# Patient Record
Sex: Female | Born: 2000 | Race: White | Hispanic: No | Marital: Single | Smoking: Never smoker
Health system: Southern US, Community
[De-identification: ages and names within clinical notes are randomized; demographics above are authoritative.]

## PROBLEM LIST (undated history)

## (undated) DIAGNOSIS — F419 Anxiety disorder, unspecified: Secondary | ICD-10-CM

## (undated) DIAGNOSIS — J45909 Unspecified asthma, uncomplicated: Secondary | ICD-10-CM

---
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
## 2021-06-12 ENCOUNTER — Other Ambulatory Visit: Payer: Self-pay

## 2021-06-12 ENCOUNTER — Encounter (HOSPITAL_BASED_OUTPATIENT_CLINIC_OR_DEPARTMENT_OTHER): Payer: Self-pay | Admitting: *Deleted

## 2021-06-12 ENCOUNTER — Emergency Department (HOSPITAL_BASED_OUTPATIENT_CLINIC_OR_DEPARTMENT_OTHER)
Admission: EM | Admit: 2021-06-12 | Discharge: 2021-06-12 | Disposition: A | Discharge status: Home / Self Care | Payer: 59 | Attending: Emergency Medicine | Admitting: Emergency Medicine

## 2021-06-12 DIAGNOSIS — J45909 Unspecified asthma, uncomplicated: Secondary | ICD-10-CM | POA: Insufficient documentation

## 2021-06-12 DIAGNOSIS — F410 Panic disorder [episodic paroxysmal anxiety] without agoraphobia: Secondary | ICD-10-CM | POA: Insufficient documentation

## 2021-06-12 HISTORY — DX: Anxiety disorder, unspecified: F41.9

## 2021-06-12 LAB — COMPREHENSIVE METABOLIC PANEL
ALT: 15 U/L (ref 0–44)
AST: 20 U/L (ref 15–41)
Albumin: 4.2 g/dL (ref 3.5–5.0)
Alkaline Phosphatase: 75 U/L (ref 38–126)
Anion gap: 9 (ref 5–15)
BUN: 12 mg/dL (ref 6–20)
CO2: 23 mmol/L (ref 22–32)
Calcium: 9.2 mg/dL (ref 8.9–10.3)
Chloride: 104 mmol/L (ref 98–111)
Creatinine, Ser: 0.84 mg/dL (ref 0.44–1.00)
GFR, Estimated: >60 mL/min (ref >60)
Glucose, Bld: 104 mg/dL — ABNORMAL HIGH (ref 70–99)
Potassium: 3.8 mmol/L (ref 3.5–5.1)
Sodium: 136 mmol/L (ref 135–145)
Total Bilirubin: 0.4 mg/dL (ref 0.3–1.2)
Total Protein: 7 g/dL (ref 6.5–8.1)

## 2021-06-12 LAB — CBC WITH DIFFERENTIAL/PLATELET
Abs Immature Granulocytes: 0.02 10*3/uL (ref 0.00–0.07)
Basophils Absolute: 0 10*3/uL (ref 0.0–0.1)
Basophils Relative: 1 %
Eosinophils Absolute: 0.1 10*3/uL (ref 0.0–0.5)
Eosinophils Relative: 2 %
HCT: 38.2 % (ref 36.0–46.0)
Hemoglobin: 13 g/dL (ref 12.0–15.0)
Immature Granulocytes: 0 %
Lymphocytes Relative: 26 %
Lymphs Abs: 1.5 10*3/uL (ref 0.7–4.0)
MCH: 28.6 pg (ref 26.0–34.0)
MCHC: 34 g/dL (ref 30.0–36.0)
MCV: 84 fL (ref 80.0–100.0)
Monocytes Absolute: 0.4 10*3/uL (ref 0.1–1.0)
Monocytes Relative: 7 %
Neutro Abs: 3.6 10*3/uL (ref 1.7–7.7)
Neutrophils Relative %: 64 %
Platelets: 268 10*3/uL (ref 150–400)
RBC: 4.55 MIL/uL (ref 3.87–5.11)
RDW: 14.6 % (ref 11.5–15.5)
WBC: 5.7 10*3/uL (ref 4.0–10.5)
nRBC: 0 % (ref 0.0–0.2)

## 2021-06-12 LAB — URINALYSIS, ROUTINE W REFLEX MICROSCOPIC
Bilirubin Urine: NEGATIVE
Glucose, UA: NEGATIVE mg/dL
Ketones, ur: NEGATIVE mg/dL
Leukocytes,Ua: NEGATIVE
Nitrite: NEGATIVE
Protein, ur: NEGATIVE mg/dL
Specific Gravity, Urine: 1.01 (ref 1.005–1.030)
pH: 7 (ref 5.0–8.0)

## 2021-06-12 LAB — TSH: TSH: 0.611 u[IU]/mL (ref 0.350–4.500)

## 2021-06-12 LAB — URINALYSIS, MICROSCOPIC (REFLEX)

## 2021-06-12 LAB — HCG, SERUM, QUALITATIVE: Preg, Serum: NEGATIVE

## 2021-06-12 MED ORDER — HYDROXYZINE HCL 25 MG PO TABS: 25.0000 mg | ORAL_TABLET | Freq: Three times a day (TID) | ORAL | 0 refills | Status: AC

## 2021-06-12 NOTE — ED Triage Notes (Signed)
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 States she felt like she was going to have another today and took a hydroxyzine one hour pta. Pt tearful in triage. Denies HI. Denies active SI
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
## 2021-06-12 NOTE — Discharge Instructions (Addendum)
Your laboratory results are within normal limits on today's visit.  Your thyroid level will not return for the next 48 hours, you will need to check on this level via MyChart.  You will also need to schedule an appointment with a therapist in order to receive further management for your recurrent panic attacks.  I have provided medication to help with your symptoms. Please take this medication as prescribed.

## 2023-12-11 ENCOUNTER — Encounter: Payer: No Typology Code available for payment source | Admitting: Advanced Practice Midwife

## 2023-12-31 NOTE — Progress Notes (Deleted)
   ANNUAL EXAM Patient name: Janiaya Ryser MRN [id number]  Date of birth: Mar 09, 2001 Chief Complaint:   No chief complaint on file.  History of Present Illness:   Wanisha Shiroma is a 23 y.o. No obstetric history on file. {race:25618} female being seen today for a routine annual exam.  Current complaints: ***  No LMP recorded.   The pregnancy intention screening data noted above was reviewed. Potential methods of contraception were discussed. The patient elected to proceed with No data recorded.   Last pap ***. Results were: {Pap findings:25134}. H/O abnormal pap: {yes/yes***/no:23866} Last mammogram: ***. Results were: {normal, abnormal, n/a:23837}. Family h/o breast cancer: {yes***/no:23838} Last colonoscopy: ***. Results were: {normal, abnormal, n/a:23837}. Family h/o colorectal cancer: {yes***/no:23838} STI testing:  Contraception:        No data to display               No data to display           Review of Systems:   Pertinent items are noted in HPI Denies any headaches, blurred vision, fatigue, shortness of breath, chest pain, abdominal pain, abnormal vaginal discharge/itching/odor/irritation, problems with periods, bowel movements, urination, or intercourse unless otherwise stated above. Pertinent History Reviewed:  Reviewed past medical,surgical, social and family history.  Reviewed problem list, medications and allergies. Physical Assessment:  There were no vitals filed for this visit.There is no height or weight on file to calculate BMI.        Physical Examination:   General appearance - well appearing, and in no distress  Mental status - alert, oriented to person, place, and time  Psych:  She has a normal mood and affect  Skin - warm and dry, normal color, no suspicious lesions noted  Chest - effort normal, all lung fields clear to auscultation bilaterally  Heart - normal rate and regular rhythm  Neck:  midline trachea, no thyromegaly or nodules  Breasts -  breasts appear normal, no suspicious masses, no skin or nipple changes or  axillary nodes  Abdomen - soft, nontender, nondistended, no masses or organomegaly  Pelvic - VULVA: normal appearing vulva with no masses, tenderness or lesions  VAGINA: normal appearing vagina with normal color and discharge, no lesions  CERVIX: normal appearing cervix without discharge or lesions, no CMT  Thin prep pap is {Desc; done/not:10129} *** HR HPV cotesting  UTERUS: uterus is felt to be normal size, shape, consistency and nontender   ADNEXA: No adnexal masses or tenderness noted.  Extremities:  No swelling or varicosities noted  Chaperone present for exam  No results found for this or any previous visit (from the past 24 hours).  Assessment & Plan:  - Cervical cancer screening: Discussed screening Q3 years. Reviewed importance of annual exams and limits of pap smear. Pap with reflex HPV *** - GC/CT: Discussed and recommended. Pt  {Blank single:19197::"accepts","declines"} - Gardasil: {Blank single:19197::"***","has not yet had. Will provide information","completed","has not yet had. Counseling provided and she declines","Has not yet had. Counseling provided and pt accepts"} - Birth Control: {Birth control type:23956} - Breast Health: Encouraged self breast awareness/exams. Teaching provided. - Mammogram: {Mammo f/u:25212::"@ 25yo"}, or sooner if problems - Colonoscopy: {TCS f/u:25213::"@ 23yo"}, or sooner if problems - Follow-up: 12 months and prn   No orders of the defined types were placed in this encounter.   Meds: No orders of the defined types were placed in this encounter.   Follow-up: No follow-ups on file.  Ralene Muskrat, [place] 12/31/2023 2:50 PM

## 2024-01-01 ENCOUNTER — Encounter: Payer: No Typology Code available for payment source | Admitting: Physician Assistant

## 2024-01-01 DIAGNOSIS — Z124 Encounter for screening for malignant neoplasm of cervix: Secondary | ICD-10-CM

## 2024-01-01 DIAGNOSIS — Z01419 Encounter for gynecological examination (general) (routine) without abnormal findings: Secondary | ICD-10-CM
